# Patient Record
Sex: Male | Born: 1970 | Race: White | Hispanic: No | Marital: Married
Health system: Southern US, Community
[De-identification: ages and names within clinical notes are randomized; demographics above are authoritative.]

---

## 2006-02-13 ENCOUNTER — Ambulatory Visit: Payer: Self-pay | Admitting: Orthopaedic Surgery

## 2006-03-03 ENCOUNTER — Ambulatory Visit: Payer: Self-pay | Admitting: Orthopaedic Surgery

## 2006-03-14 ENCOUNTER — Encounter: Payer: Self-pay | Admitting: Orthopaedic Surgery

## 2006-04-14 ENCOUNTER — Encounter: Payer: Self-pay | Admitting: Orthopaedic Surgery

## 2006-05-14 ENCOUNTER — Encounter: Payer: Self-pay | Admitting: Orthopaedic Surgery

## 2006-08-21 ENCOUNTER — Ambulatory Visit: Payer: Self-pay | Admitting: Orthopaedic Surgery

## 2008-01-03 ENCOUNTER — Ambulatory Visit: Payer: Self-pay | Admitting: Internal Medicine

## 2008-05-13 ENCOUNTER — Ambulatory Visit: Payer: Self-pay | Admitting: Internal Medicine

## 2014-01-16 ENCOUNTER — Ambulatory Visit: Payer: Self-pay

## 2014-01-16 LAB — URINALYSIS, COMPLETE
BACTERIA: NEGATIVE
GLUCOSE, UR: NEGATIVE mg/dL (ref 0–75)
Ketone: NEGATIVE
Leukocyte Esterase: NEGATIVE
NITRITE: NEGATIVE
PH: 6.5 (ref 4.5–8.0)
Protein: NEGATIVE
SPECIFIC GRAVITY: 1.01 (ref 1.003–1.030)
Squamous Epithelial: NONE SEEN
WBC UR: NONE SEEN /HPF (ref 0–5)

## 2014-01-16 LAB — CBC WITH DIFFERENTIAL/PLATELET
Basophil #: 0 10*3/uL (ref 0.0–0.1)
Basophil %: 0.6 %
Eosinophil #: 0.4 10*3/uL (ref 0.0–0.7)
Eosinophil %: 6.7 %
HCT: 45.2 % (ref 40.0–52.0)
HGB: 15.3 g/dL (ref 13.0–18.0)
LYMPHS ABS: 1 10*3/uL (ref 1.0–3.6)
LYMPHS PCT: 17.6 %
MCH: 32.2 pg (ref 26.0–34.0)
MCHC: 33.8 g/dL (ref 32.0–36.0)
MCV: 95 fL (ref 80–100)
MONO ABS: 0.9 x10 3/mm (ref 0.2–1.0)
Monocyte %: 17.1 %
Neutrophil #: 3.1 10*3/uL (ref 1.4–6.5)
Neutrophil %: 58 %
Platelet: 227 10*3/uL (ref 150–440)
RBC: 4.75 10*6/uL (ref 4.40–5.90)
RDW: 12.7 % (ref 11.5–14.5)
WBC: 5.4 10*3/uL (ref 3.8–10.6)

## 2014-01-16 LAB — COMPREHENSIVE METABOLIC PANEL
ALT: 216 U/L — AB (ref 12–78)
ANION GAP: 10 (ref 7–16)
AST: 80 U/L — AB (ref 15–37)
Albumin: 3.8 g/dL (ref 3.4–5.0)
Alkaline Phosphatase: 300 U/L — ABNORMAL HIGH
BUN: 5 mg/dL — ABNORMAL LOW (ref 7–18)
Bilirubin,Total: 4.3 mg/dL — ABNORMAL HIGH (ref 0.2–1.0)
CALCIUM: 8.9 mg/dL (ref 8.5–10.1)
CHLORIDE: 102 mmol/L (ref 98–107)
CREATININE: 0.88 mg/dL (ref 0.60–1.30)
Co2: 27 mmol/L (ref 21–32)
EGFR (Non-African Amer.): >60
Glucose: 86 mg/dL (ref 65–99)
Osmolality: 274 (ref 275–301)
Potassium: 4.3 mmol/L (ref 3.5–5.1)
SODIUM: 139 mmol/L (ref 136–145)
Total Protein: 7.4 g/dL (ref 6.4–8.2)

## 2014-01-16 LAB — RAPID INFLUENZA A&B ANTIGENS (ARMC ONLY)

## 2014-01-16 LAB — CK TOTAL AND CKMB (NOT AT ARMC)
CK, Total: 139 U/L
CK-MB: 0.5 ng/mL (ref 0.5–3.6)

## 2014-01-16 LAB — MONONUCLEOSIS SCREEN: Mono Test: NEGATIVE

## 2014-01-17 ENCOUNTER — Observation Stay: Payer: Self-pay | Admitting: Internal Medicine

## 2014-01-17 ENCOUNTER — Ambulatory Visit: Payer: Self-pay | Admitting: Internal Medicine

## 2014-01-18 LAB — CBC WITH DIFFERENTIAL/PLATELET
Basophil #: 0 10*3/uL (ref 0.0–0.1)
Basophil %: 0.8 %
Eosinophil #: 0.4 10*3/uL (ref 0.0–0.7)
Eosinophil %: 7.7 %
HCT: 39.9 % — ABNORMAL LOW (ref 40.0–52.0)
HGB: 13.6 g/dL (ref 13.0–18.0)
Lymphocyte #: 0.9 10*3/uL — ABNORMAL LOW (ref 1.0–3.6)
Lymphocyte %: 16.2 %
MCH: 32.5 pg (ref 26.0–34.0)
MCHC: 34 g/dL (ref 32.0–36.0)
MCV: 96 fL (ref 80–100)
Monocyte #: 0.9 x10 3/mm (ref 0.2–1.0)
Monocyte %: 16.8 %
Neutrophil #: 3.3 10*3/uL (ref 1.4–6.5)
Neutrophil %: 58.5 %
Platelet: 208 10*3/uL (ref 150–440)
RBC: 4.18 10*6/uL — ABNORMAL LOW (ref 4.40–5.90)
RDW: 12.6 % (ref 11.5–14.5)
WBC: 5.6 10*3/uL (ref 3.8–10.6)

## 2014-01-18 LAB — URINALYSIS, COMPLETE
BILIRUBIN, UR: NEGATIVE
BLOOD: NEGATIVE
Bacteria: NONE SEEN
KETONE: NEGATIVE
LEUKOCYTE ESTERASE: NEGATIVE
Nitrite: NEGATIVE
PH: 6 (ref 4.5–8.0)
Protein: NEGATIVE
RBC,UR: 1 /HPF (ref 0–5)
Specific Gravity: 1.006 (ref 1.003–1.030)
Squamous Epithelial: NONE SEEN
WBC UR: NONE SEEN /HPF (ref 0–5)

## 2014-01-18 LAB — IRON AND TIBC
Iron Bind.Cap.(Total): 393 ug/dL (ref 250–450)
Iron Saturation: 27 %
Iron: 105 ug/dL (ref 65–175)
Unbound Iron-Bind.Cap.: 288 ug/dL

## 2014-01-18 LAB — COMPREHENSIVE METABOLIC PANEL
Albumin: 3 g/dL — ABNORMAL LOW (ref 3.4–5.0)
Alkaline Phosphatase: 299 U/L — ABNORMAL HIGH
Anion Gap: 3 — ABNORMAL LOW (ref 7–16)
BUN: 7 mg/dL (ref 7–18)
Bilirubin,Total: 4 mg/dL — ABNORMAL HIGH (ref 0.2–1.0)
Calcium, Total: 8.1 mg/dL — ABNORMAL LOW (ref 8.5–10.1)
Chloride: 108 mmol/L — ABNORMAL HIGH (ref 98–107)
Co2: 28 mmol/L (ref 21–32)
Creatinine: 1.09 mg/dL (ref 0.60–1.30)
EGFR (African American): >60
EGFR (Non-African Amer.): >60
Glucose: 95 mg/dL (ref 65–99)
Osmolality: 275 (ref 275–301)
Potassium: 4.1 mmol/L (ref 3.5–5.1)
SGOT(AST): 95 U/L — ABNORMAL HIGH (ref 15–37)
SGPT (ALT): 193 U/L — ABNORMAL HIGH (ref 12–78)
Sodium: 139 mmol/L (ref 136–145)
Total Protein: 6.1 g/dL — ABNORMAL LOW (ref 6.4–8.2)

## 2014-01-18 LAB — ACETAMINOPHEN LEVEL: Acetaminophen: <2 ug/mL

## 2014-01-18 LAB — DRUG SCREEN, URINE
AMPHETAMINES, UR SCREEN: NEGATIVE (ref ?–1000)
BENZODIAZEPINE, UR SCRN: NEGATIVE (ref ?–200)
Barbiturates, Ur Screen: NEGATIVE (ref ?–200)
CANNABINOID 50 NG, UR ~~LOC~~: NEGATIVE (ref ?–50)
Cocaine Metabolite,Ur ~~LOC~~: NEGATIVE (ref ?–300)
MDMA (ECSTASY) UR SCREEN: NEGATIVE (ref ?–500)
Methadone, Ur Screen: NEGATIVE (ref ?–300)
Opiate, Ur Screen: NEGATIVE (ref ?–300)
PHENCYCLIDINE (PCP) UR S: NEGATIVE (ref ?–25)
Tricyclic, Ur Screen: NEGATIVE (ref ?–1000)

## 2014-01-18 LAB — URINE CULTURE

## 2014-01-18 LAB — FERRITIN: Ferritin (ARMC): 250 ng/mL (ref 8–388)

## 2015-03-07 NOTE — Consult Note (Signed)
Brief Consult Note: Diagnosis: elev liver enzymes.   Patient was seen by consultant.   Consult note dictated.   Discussed with Attending MD.   Comments: 1.) Elev liver enzymes: predominantly t.bili.  MRCP negative.  u/s with dopplers shows mildly slow flow in portal vein but otherwise normal.  No evidence that this is obstructive jaundice, is more consitent with cholestatis.  labs so far neg but serologic w/u still pending.  T.bili stable today at 4 which is very re-assuring.   I suspect this is cholestatis from the bactrim that he has been taking.    Recs: - stop bactrim - also stop workout supplements - will recheck liver enzymes on Monday at [facility] - UDS - ok with d/c today - pt knows to go back to ED if he develosp fever, chills, abd pain, worsening jaundice.  Electronic Signatures: Dow Adolphein, Matthew (MD)  (Signed 07-Mar-15 18:33)  Authored: Brief Consult Note   Last Updated: 07-Mar-15 18:33 by Dow Adolphein, Matthew (MD)
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
## 2015-06-07 IMAGING — US US ARTFLOW
1 series · 13 of 16 positions shown · non-contrast
Comparison: MR ABDOMEN WO/W CM MRCP dated 01/17/2014; US ABDOMEN
LIMITED RUQ/ASCITES dated 01/18/2014

CLINICAL DATA: Abnormal liver function tests.

EXAM:
DUPLEX ULTRASOUND OF LIVER
TECHNIQUE: Color and duplex Doppler ultrasound was performed to evaluate the
hepatic in-flow and out-flow vessels.

[Series 1: us artflow · 0.24mm/px · 13 of 28 slices shown]
[im 1/28]
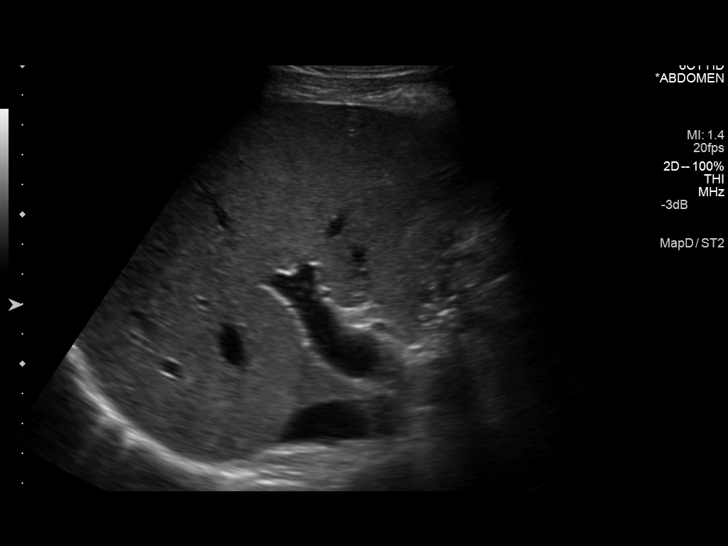
[im 2/28]
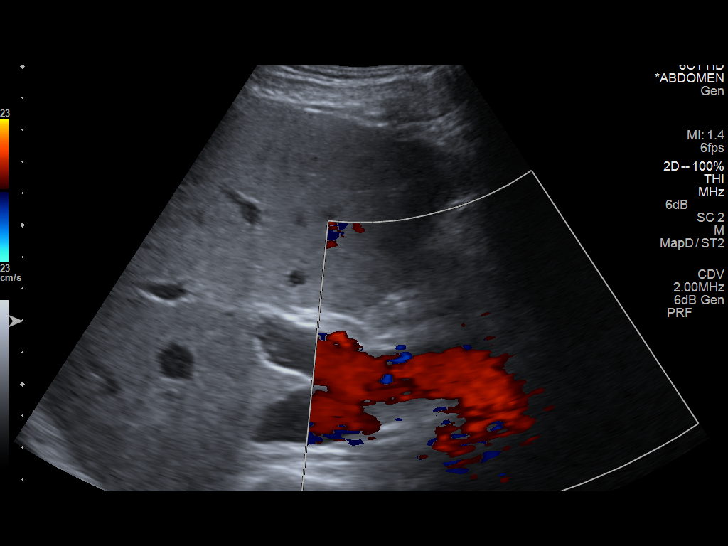
[im 6/28]
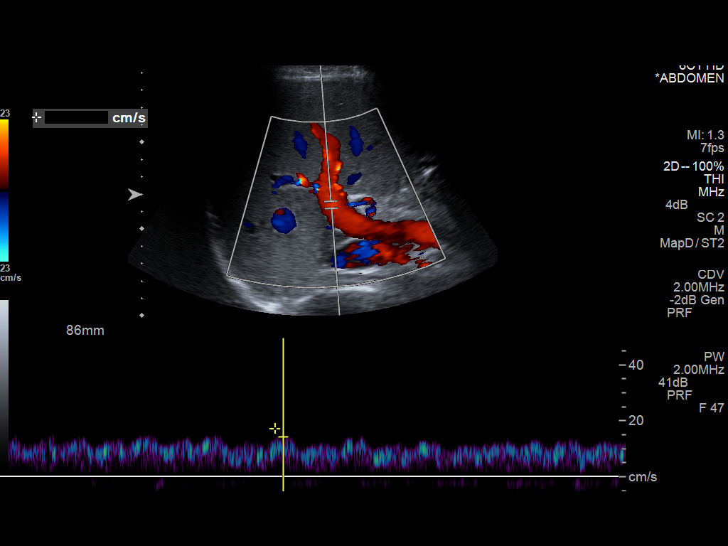
[im 8/28]
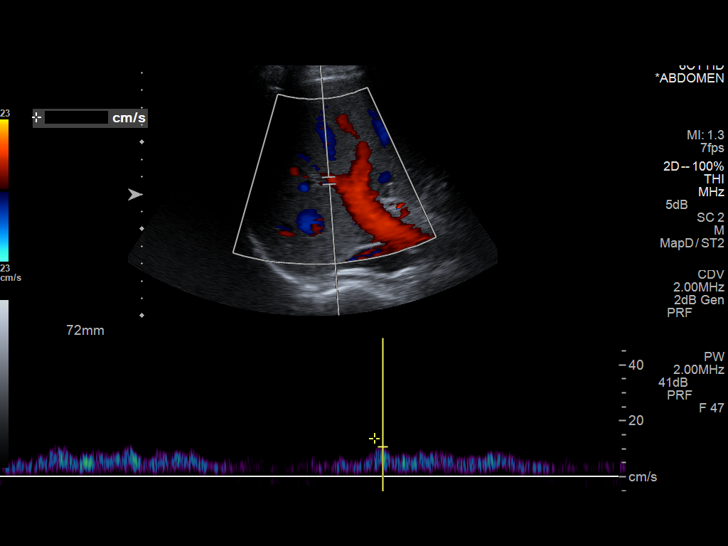
[im 10/28]
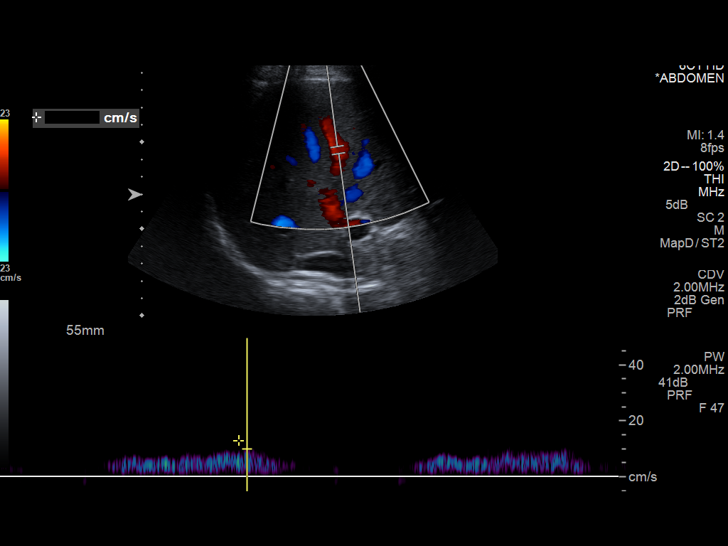
[im 11/28]
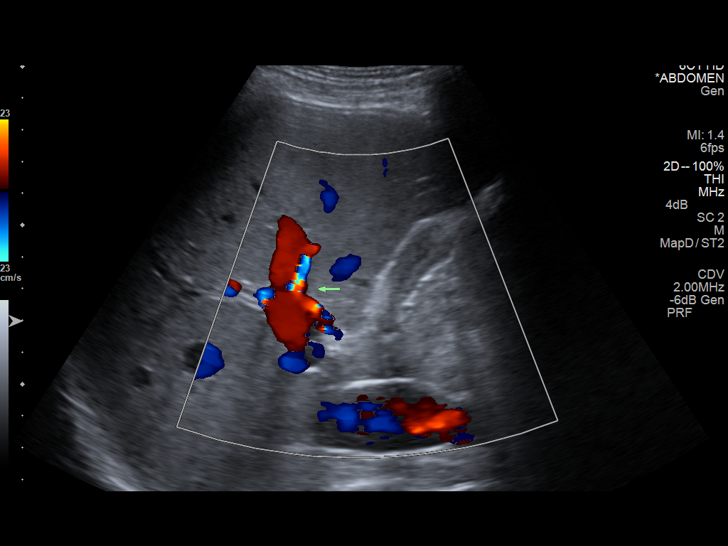
[im 15/28]
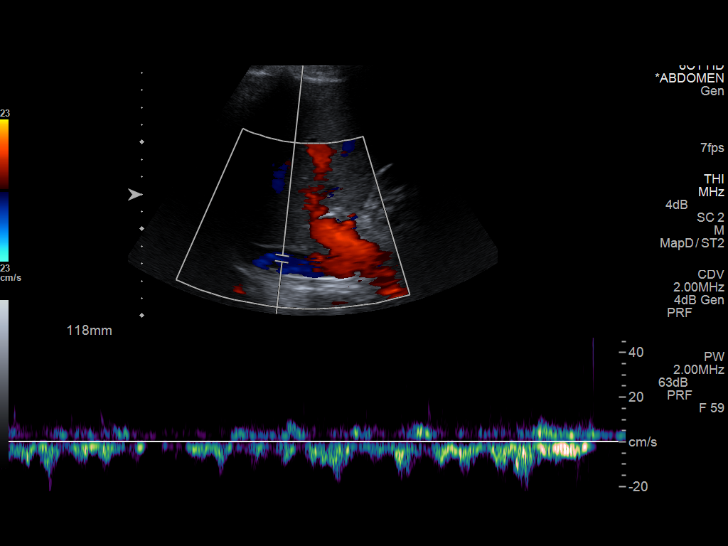
[im 17/28]
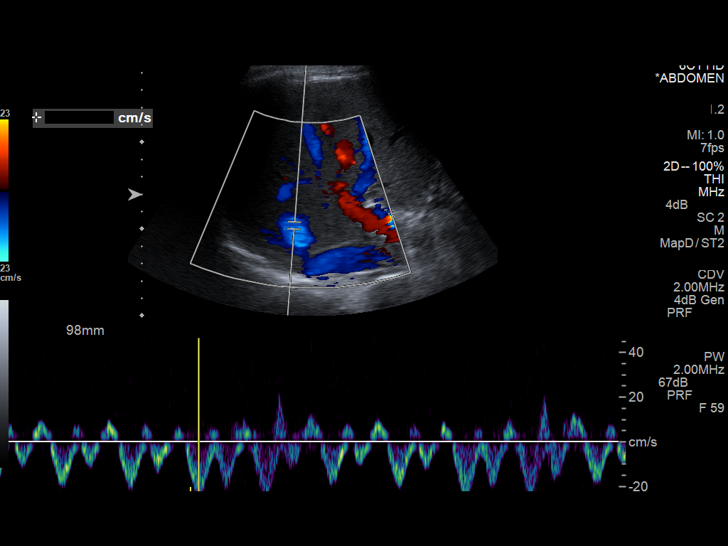
[im 19/28]
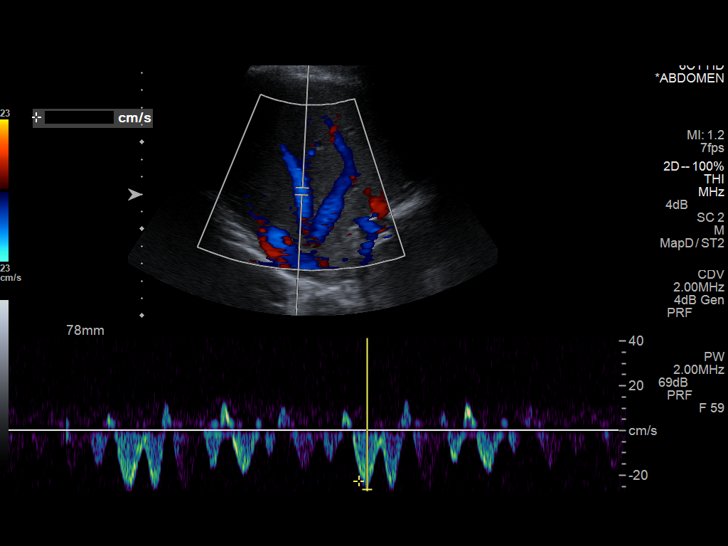
[im 20/28]
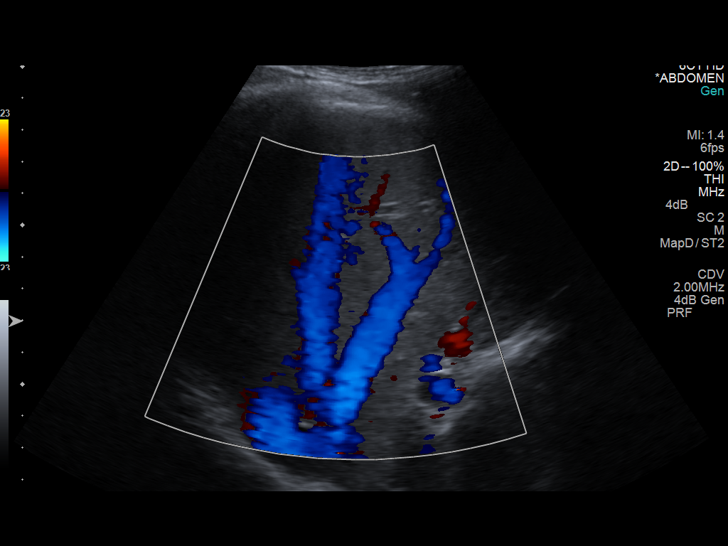
[im 22/28]
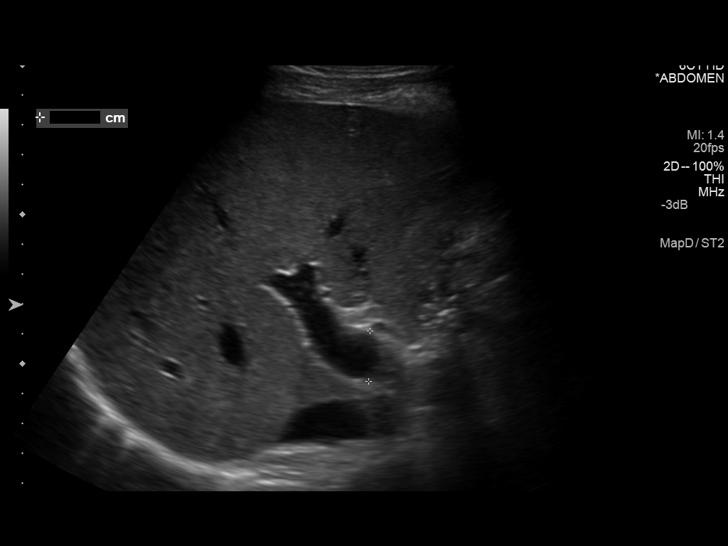
[im 26/28]
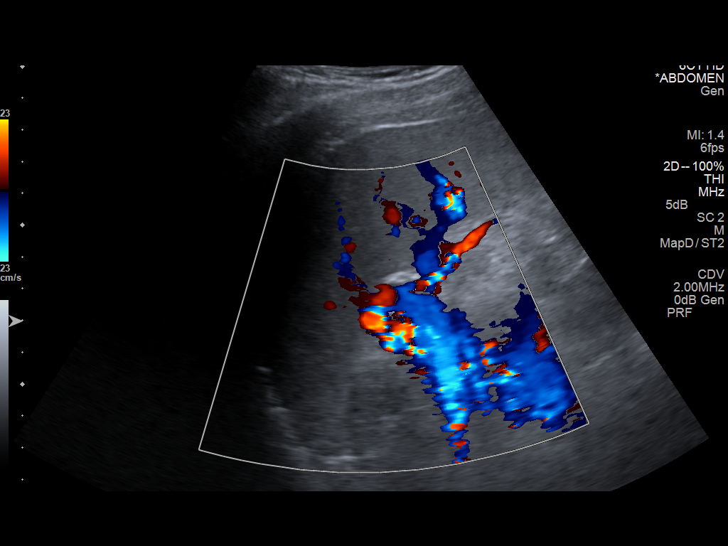
[im 28/28]
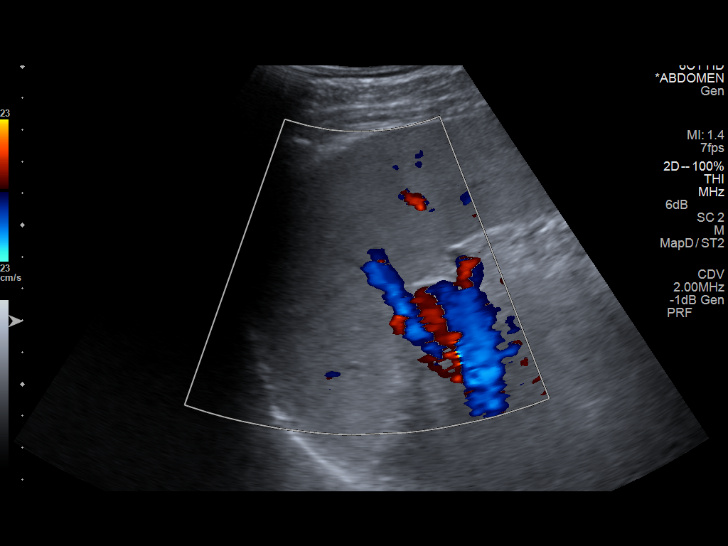

[13 of 16 positions shown; findings below may reference images not displayed]

FINDINGS: Portal Vein Velocities

Main:  11-14 cm/sec

Right:  11 cm/sec

Left:  10 cm/sec

Hepatic Vein Velocities

Right:  26 cm/sec

Middle:  26 cm/sec

Left:  24 cm/sec

Hepatic Artery Velocity:  116 cm/sec

Splenic Vein Velocity:  14 cm/sec

Varices: None identified.

Ascites: None identified.

The portal vein is patent and shows hepatopetal flow. No portal vein
thrombus is identified. Portal vein velocities are reduced without
reversed flow. The portal vein is also dilated with maximal portal
vein diameter of approximately 17 mm. Reduction of velocities is
consistent with portal hypertension.

Hepatic arterial waveforms showed normal resistance pattern. The
hepatic veins and intrahepatic IVC are widely patent and show normal
waveforms without evidence of pulsatility or hepatic Berovicfann
disease. The splenic vein is patent.

The spleen appears mildly prominent by ultrasound but is not overtly
enlarged with estimated splenic volume of 241 mL by ultrasound
dimensions.
IMPRESSION: Diffusely decreased portal vein velocities are consistent with
component of portal hypertension. There is no evidence of portal
vein thrombus or reversed flow. The hepatic veins and hepatic
arteries show normal patency and spectral Doppler waveforms.

## 2021-04-14 ENCOUNTER — Other Ambulatory Visit: Payer: Self-pay | Admitting: Physician Assistant

## 2021-04-14 ENCOUNTER — Other Ambulatory Visit: Payer: Self-pay

## 2021-04-14 ENCOUNTER — Ambulatory Visit
Admission: RE | Admit: 2021-04-14 | Discharge: 2021-04-14 | Disposition: A | Discharge status: Home / Self Care | Payer: Managed Care, Other (non HMO) | Source: Ambulatory Visit | Attending: Physician Assistant | Admitting: Physician Assistant

## 2021-04-14 DIAGNOSIS — M79604 Pain in right leg: Secondary | ICD-10-CM

## 2022-09-01 IMAGING — US US EXTREM LOW VENOUS*R*
1 series · 13 of 24 positions shown · non-contrast
Comparison: None.

CLINICAL DATA: Right leg pain and edema



[Series 1: us venous img lower uni right (dvt) · portal-venous · 13 of 34 slices shown]
[im 1/34]
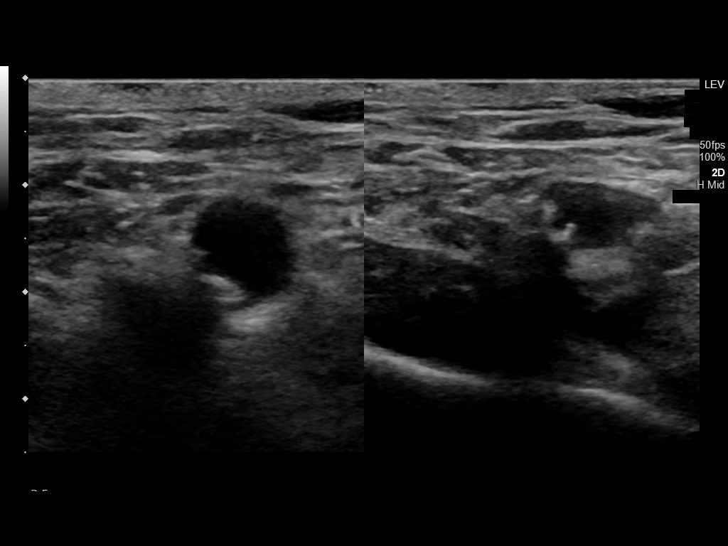
[im 3/34]
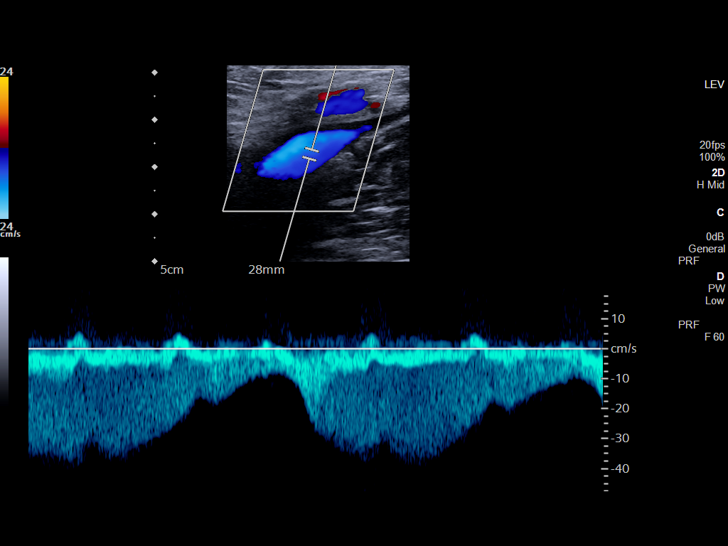
[im 6/34]
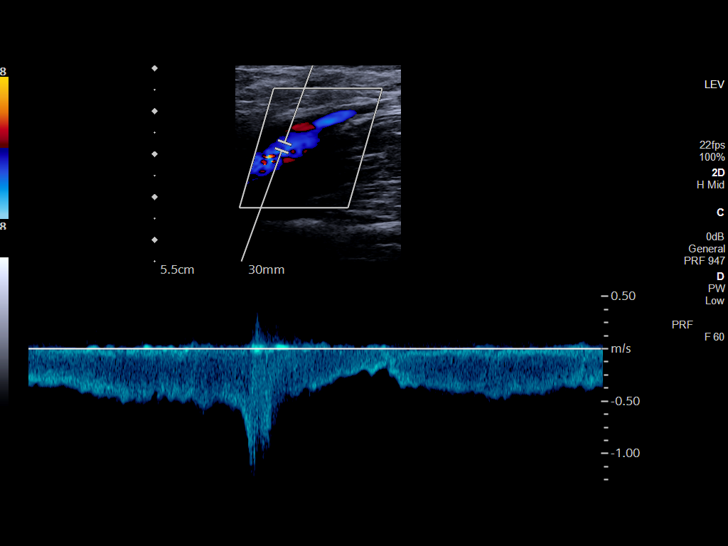
[im 9/34]
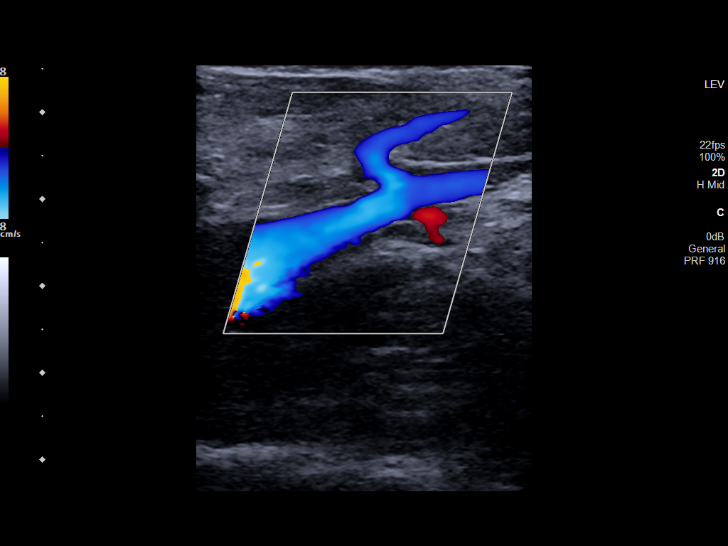
[im 12/34]
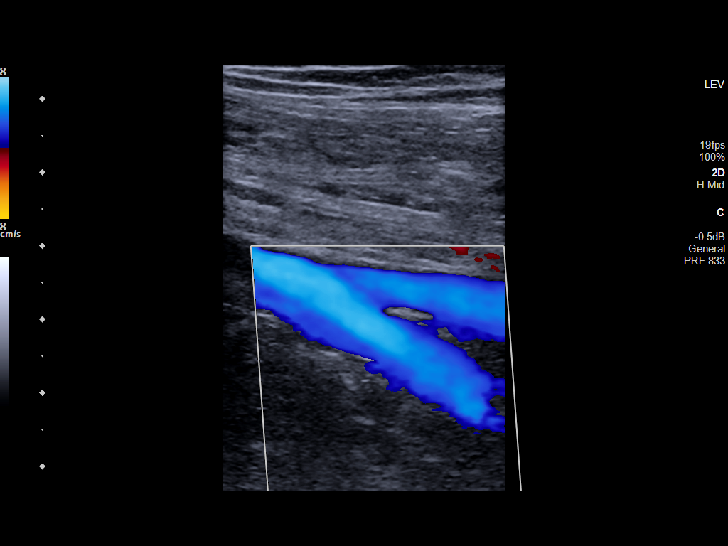
[im 15/34]
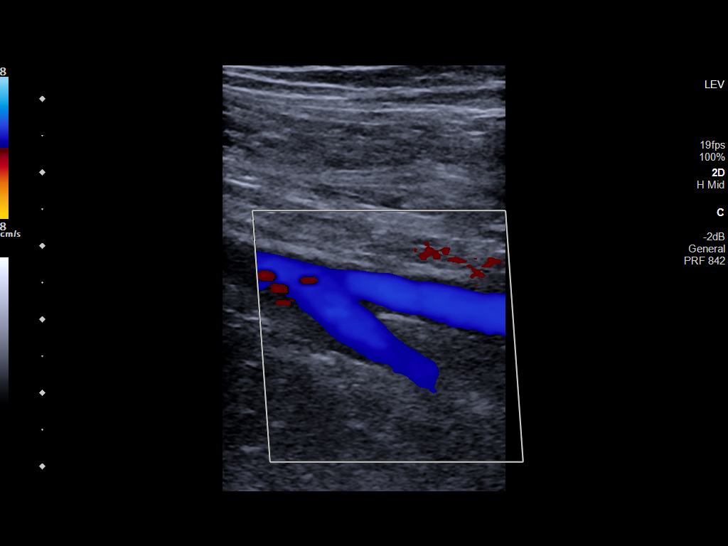
[im 18/34]
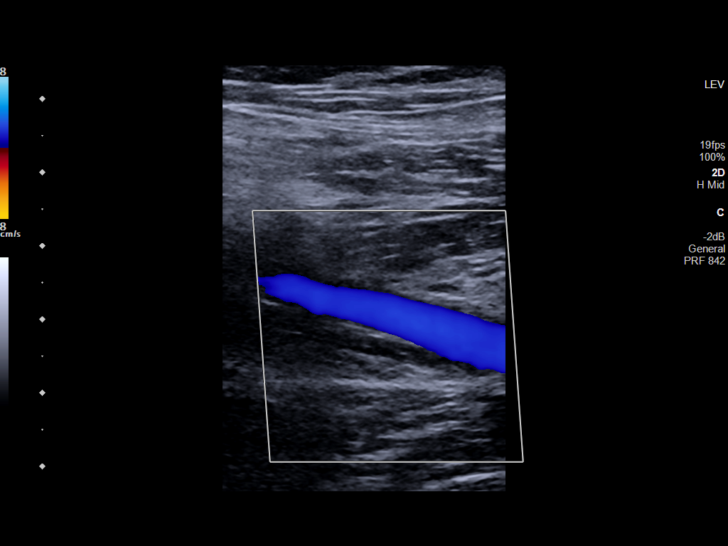
[im 19/34]
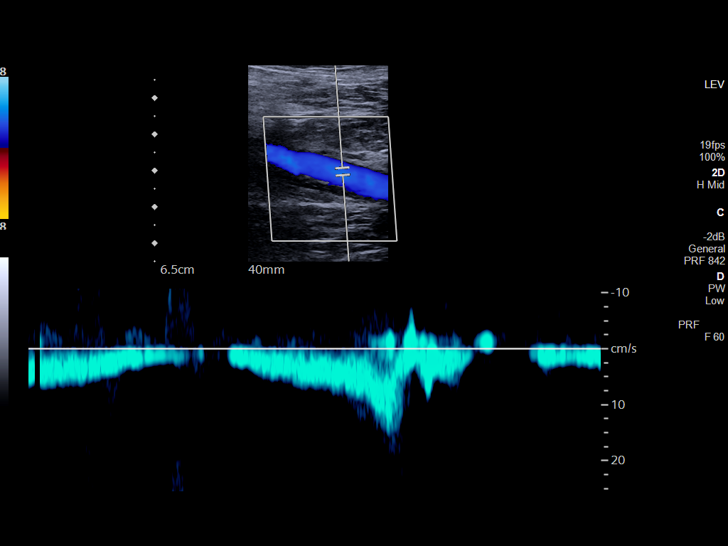
[im 22/34]
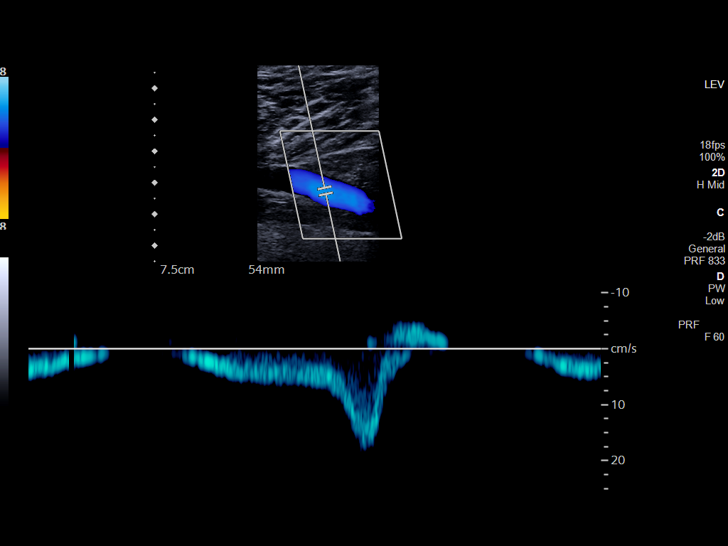
[im 25/34]
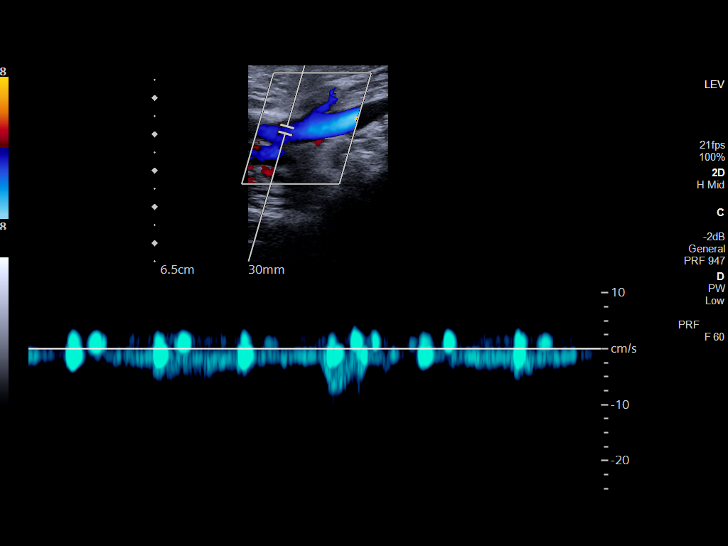
[im 28/34]
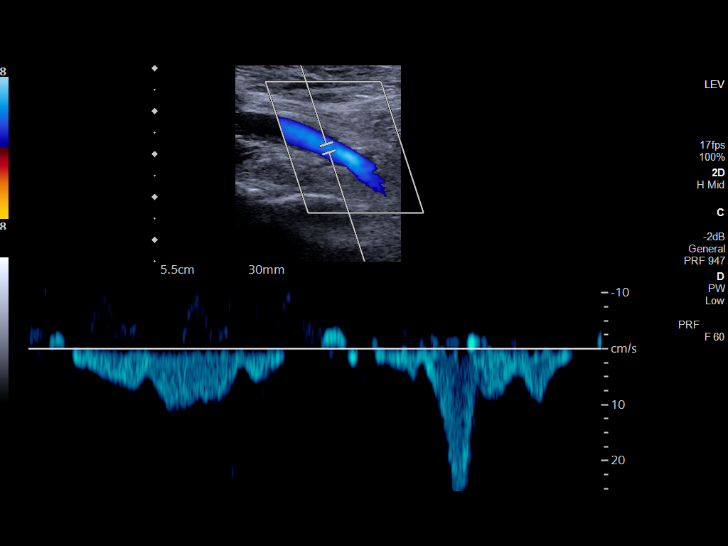
[im 31/34]
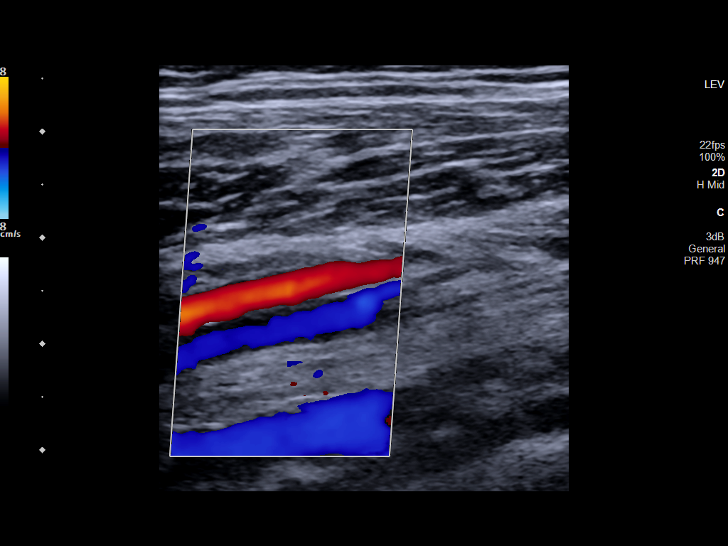
[im 34/34]
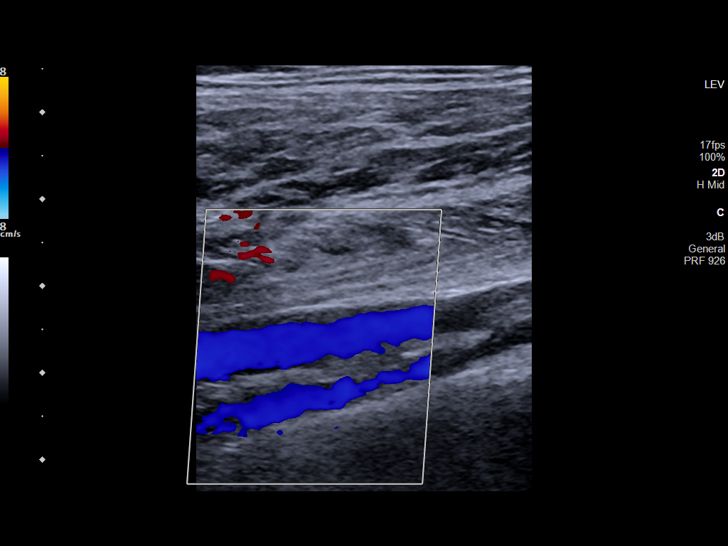

[13 of 24 positions shown; findings below may reference images not displayed]

FINDINGS: Contralateral Common Femoral Vein: Respiratory phasicity is normal
and symmetric with the symptomatic side. No evidence of thrombus.
Normal compressibility.

Common Femoral Vein: No evidence of thrombus. Normal
compressibility, respiratory phasicity and response to augmentation.

Saphenofemoral Junction: No evidence of thrombus. Normal
compressibility and flow on color Doppler imaging.

Profunda Femoral Vein: No evidence of thrombus. Normal
compressibility and flow on color Doppler imaging.

Femoral Vein: No evidence of thrombus. Normal compressibility,
respiratory phasicity and response to augmentation.

Popliteal Vein: No evidence of thrombus. Normal compressibility,
respiratory phasicity and response to augmentation.

Calf Veins: No evidence of thrombus. Normal compressibility and flow
on color Doppler imaging.
IMPRESSION: No evidence of deep venous thrombosis.
